# Patient Record
Sex: Female | Born: 1965 | Race: White | Hispanic: No | Marital: Single | Smoking: Current every day smoker
Health system: Southern US, Community
[De-identification: ages and names within clinical notes are randomized; demographics above are authoritative.]

## PROBLEM LIST (undated history)

## (undated) DIAGNOSIS — R51 Headache: Secondary | ICD-10-CM

## (undated) DIAGNOSIS — G8929 Other chronic pain: Secondary | ICD-10-CM

## (undated) DIAGNOSIS — E785 Hyperlipidemia, unspecified: Secondary | ICD-10-CM

## (undated) DIAGNOSIS — I10 Essential (primary) hypertension: Secondary | ICD-10-CM

## (undated) HISTORY — PX: APPENDECTOMY: SHX54

## (undated) HISTORY — DX: Hyperlipidemia, unspecified: E78.5

## (undated) HISTORY — PX: OTHER SURGICAL HISTORY: SHX169

## (undated) HISTORY — PX: BREAST SURGERY: SHX581

## (undated) HISTORY — DX: Headache: R51

## (undated) HISTORY — DX: Essential (primary) hypertension: I10

## (undated) HISTORY — DX: Other chronic pain: G89.29

## (undated) HISTORY — PX: ABDOMINAL HYSTERECTOMY: SHX81

---

## 1999-09-30 ENCOUNTER — Encounter: Payer: Self-pay | Admitting: Family Medicine

## 1999-09-30 ENCOUNTER — Ambulatory Visit (HOSPITAL_COMMUNITY): Admission: RE | Admit: 1999-09-30 | Discharge: 1999-09-30 | Payer: Self-pay | Admitting: Family Medicine

## 2006-10-17 ENCOUNTER — Ambulatory Visit: Payer: Self-pay

## 2006-10-23 ENCOUNTER — Inpatient Hospital Stay: Payer: Self-pay

## 2007-02-11 ENCOUNTER — Emergency Department: Payer: Self-pay | Admitting: Emergency Medicine

## 2008-10-04 ENCOUNTER — Emergency Department: Payer: Self-pay | Admitting: Unknown Physician Specialty

## 2009-09-09 ENCOUNTER — Emergency Department: Payer: Self-pay | Admitting: Emergency Medicine

## 2009-10-06 ENCOUNTER — Ambulatory Visit: Payer: Self-pay | Admitting: Gastroenterology

## 2010-03-19 ENCOUNTER — Emergency Department: Payer: Self-pay | Admitting: Emergency Medicine

## 2010-05-08 LAB — HM MAMMOGRAPHY

## 2011-04-10 ENCOUNTER — Ambulatory Visit: Payer: Self-pay

## 2011-08-30 ENCOUNTER — Ambulatory Visit: Payer: Self-pay | Admitting: Internal Medicine

## 2011-09-01 ENCOUNTER — Ambulatory Visit: Payer: Self-pay

## 2012-01-06 DIAGNOSIS — E0590 Thyrotoxicosis, unspecified without thyrotoxic crisis or storm: Secondary | ICD-10-CM | POA: Insufficient documentation

## 2012-03-06 ENCOUNTER — Emergency Department: Payer: Self-pay | Admitting: Internal Medicine

## 2012-04-22 ENCOUNTER — Emergency Department: Payer: Self-pay | Admitting: Emergency Medicine

## 2012-04-22 LAB — COMPREHENSIVE METABOLIC PANEL
Alkaline Phosphatase: 100 U/L (ref 50–136)
Calcium, Total: 9.4 mg/dL (ref 8.5–10.1)
Chloride: 105 mmol/L (ref 98–107)
Co2: 25 mmol/L (ref 21–32)
EGFR (African American): >60
EGFR (Non-African Amer.): >60
SGOT(AST): 29 U/L (ref 15–37)
SGPT (ALT): 38 U/L (ref 12–78)

## 2012-04-22 LAB — CK TOTAL AND CKMB (NOT AT ARMC)
CK, Total: 94 U/L (ref 21–215)
CK-MB: 0.8 ng/mL (ref 0.5–3.6)

## 2012-04-22 LAB — URINALYSIS, COMPLETE
Bilirubin,UR: NEGATIVE
Glucose,UR: NEGATIVE mg/dL (ref 0–75)
Ketone: NEGATIVE
Protein: NEGATIVE
RBC,UR: NONE SEEN /HPF (ref 0–5)
Specific Gravity: 1.01 (ref 1.003–1.030)
Squamous Epithelial: 2
WBC UR: 1 /HPF (ref 0–5)

## 2012-04-22 LAB — CBC
HCT: 43.4 % (ref 35.0–47.0)
MCH: 29.1 pg (ref 26.0–34.0)
MCV: 85 fL (ref 80–100)
Platelet: 204 10*3/uL (ref 150–440)
RDW: 14.2 % (ref 11.5–14.5)
WBC: 8.6 10*3/uL (ref 3.6–11.0)

## 2012-04-22 LAB — MAGNESIUM: Magnesium: 1.9 mg/dL

## 2012-05-08 ENCOUNTER — Encounter: Payer: Self-pay | Admitting: Internal Medicine

## 2012-05-08 ENCOUNTER — Ambulatory Visit (INDEPENDENT_AMBULATORY_CARE_PROVIDER_SITE_OTHER): Payer: BC Managed Care – PPO | Admitting: Internal Medicine

## 2012-05-08 VITALS — BP 92/68 | HR 120 | Temp 98.7°F | Resp 16 | Ht 71.0 in | Wt 297.0 lb

## 2012-05-08 DIAGNOSIS — E0590 Thyrotoxicosis, unspecified without thyrotoxic crisis or storm: Secondary | ICD-10-CM

## 2012-05-08 DIAGNOSIS — F419 Anxiety disorder, unspecified: Secondary | ICD-10-CM | POA: Insufficient documentation

## 2012-05-08 DIAGNOSIS — F411 Generalized anxiety disorder: Secondary | ICD-10-CM

## 2012-05-08 DIAGNOSIS — I10 Essential (primary) hypertension: Secondary | ICD-10-CM

## 2012-05-08 DIAGNOSIS — E785 Hyperlipidemia, unspecified: Secondary | ICD-10-CM

## 2012-05-08 NOTE — Progress Notes (Addendum)
Subjective:     Patient ID: Martha [name], female   DOB: [birth year], 46 y.o.   MRN: [id number]  HPI [name] is a pleasant 46 y/o woman, referred by her PCP, for evaluation and treatment of hyperthyroidism.  I received and reviewed records from Dr. Francis Dowse B. Moffett, including latest 2 office visit notes (from 10/24 and 11/07 of this year) and thyroid labs: - 12/25/2011: TSH 0.037 (no nl range available for lab, but mentioned abnormal), fT4 1.26; no prior thyroid ds. history. She remembers that at that time she had high fever of 103.72F, went to Urgent Care and was started on ABx; she was told her thyroid is abnormal and to have tests repeated in 2 mo. - 04/18/2012: TSH 0.052, fT4 1.27, TT3 147; had occasional palpitations/increased pulse office EKG: NSR. Of note, she is on Albuterol. - 05/02/2012: TSH 0.128, fT4 1.14; Atenolol 25 mg daily started, but she does not taking it because she feels like "everything moving in slow motion" - she took the medication in am then; endo referral placed She did not have thyroid U/S or scans performed.  She tells me that she was in the ED with palpitations, BP 194/116, P 130, but left AMA as she had to wait for 4 hours. Today BP normal, at 92/68, pulse 120 at the beginning of the visit, but 102 at the end. She had 2 teeth extracted yesterday and mentions that her sBP was 120 then.  ROS: Denies lumps in her throat or dysphagia, has palpitations ~3x a day, no CP/SOB, has hyperthermia (since her TAH+BSO 2008), has weight gain (15 lbs in 2 weeks - believes she is retaining fluid after changing her BP med), denies N/V/D/C, has tremors, no visual problems. Has HAs - takes Ibuprofen. Has anxiety.   She has no family h/o thyroid ds. or autoimmune ds. per her knowledge.   Other medical history: HTN, poorly controlled, last BP in PCP's office: 160/84, P 90 HL OSA Ventricular hypokinesis (?) Fibrocystic breast ds. DJD, lumbar GERD Obesity, BMI 41 Surgical menopause  since 2008  Past Surgical History  Procedure Date  . Breast surgery     BIOPSY  1999  . Appendectomy     1974  . Abdominal hysterectomy     2008  . Right knee     TENDONS AND LIGAMENTS    Current Outpatient Prescriptions on File Prior to Visit  Medication Sig Dispense Refill  . atenolol (TENORMIN) 25 MG tablet Take 25 mg by mouth daily.      . clonazePAM (KLONOPIN) 1 MG tablet Take 1 mg by mouth daily.      Marland Kitchen olmesartan-hydrochlorothiazide (BENICAR HCT) 40-25 MG per tablet Take 1 tablet by mouth daily.      Marland Kitchen omeprazole (PRILOSEC) 40 MG capsule Take 40 mg by mouth daily.      . pravastatin (PRAVACHOL) 40 MG tablet Take 40 mg by mouth daily.        Allergies  Allergen Reactions  . Demerol (Meperidine)     Family History  Problem Relation Age of Onset  . Heart disease Mother   . Hypertension Mother   . Diabetes Mother   . Heart disease Father   . Hypertension Father   . Diabetes Father   . Cancer Maternal Grandmother     CANCER  . Heart disease Maternal Grandmother   . Hypertension Maternal Grandmother   . Diabetes Maternal Grandmother    Social history: Location manager, single, smokes 1PPD, occasional alcohol, no drugs,  exercise: starting with a personal trainer  Review of Systems See above    Objective:   Physical Exam BP 92/68  Pulse 120  Temp 98.7 F (37.1 C) (Oral)  Resp 16  Ht 5\' 11"  (1.803 m)  Wt 297 lb (134.718 kg)  BMI 41.42 kg/m2  SpO2 98% Repeat pulse 102 bpm at end of encounter.  Constitutional: overweight, in NAD Eyes: PERRLA, EOMI, no exophthalmos ENT: moist mucous membranes, no thyromegaly, no thyroid nodules palpated, no cervical lymphadenopathy Cardiovascular: tachycardia, No MRG, bilat. LE edema pitting +1 Respiratory: CTA B Gastrointestinal: abdomen soft, NT, ND, BS+ Musculoskeletal: no deformities, strength intact in all 4 Skin: moist, warm, no rashes Neurological: slight tremor with outstretched hands, DTR normal in all 4       Assessment:     1. Subclinical hyperthyroidism - likely 2/2 thyroiditis episode 4 months ago, slowly improving 2. HTN     Plan:     1. Subclinical hyperthyroidism - I explained her diagnosis to her and the fact that her actual thyroid hormone, fT4 is normal and her TSH is improving - I recommended her to restart Atenolol at 25 mg, but taken at night, rather than in am. If she can tolerate this dose, and her pulse is still >100 bpm, she can increase the dose to 50 mg nightly. Her BP was normal today, but this is low for her, however, Atenolol has relative beta 1 receptor selectivity, so it should affect her pulse more than her BP. - we will need to repeat her thyroid tests in 6-8 weeks and she would like to have these done in her PCPs office - given printout orders for TSH, fT4 and fT3 - If these labs are still abnormal and worsening, I would be tempted to next check a thyroid uptake and scan - I explained the natural course of thyroiditis, with initial hyperthyroidism, usually followed by normal thyroid status, but also possibly by hypothyroidism  2. HTN - It is not likely that the mildly low TSH is the dominant cause for her HTN episodes - pt has weight gain and fluid retention (which is chronic, but exacerbated lately), has recently switched her BP medicine to Benicar-HCT  40-25, she mentions she was on another combination pill before  - BP normal today (but low for her) - will inform PCP know  RTC in 6 months.   06/05/2012: received faxed labs performed in PCP's office (Dr. Wonda Cheng) on 05/30/2012: - TSH 0.042 -  fT4 1.58 - TT3 132  No normal ranges given for the above labs, but TSH only is highlighted as abnormal. Called pt and decided to check a Thyroid Uptake and Scan. Will schedule for her.

## 2012-05-08 NOTE — Patient Instructions (Addendum)
You have a condition named: Subclinical hyperthyroidism, which I believe was due to an episode of thyroiditis that you had this summer. Please repeat thyroid labs in Dr. Rhunette Croft office in 2 months. Please restart taking Atenolol 25 mg at night, before going to bed. If you tolerate this OK, increase to 50 mg nightly, until your pulse is <100 consistently.

## 2012-05-28 ENCOUNTER — Emergency Department: Payer: Self-pay | Admitting: Emergency Medicine

## 2012-05-28 LAB — CBC WITH DIFFERENTIAL/PLATELET
Basophil #: 0 10*3/uL (ref 0.0–0.1)
Eosinophil %: 2.1 %
HCT: 39.8 % (ref 35.0–47.0)
HGB: 13.9 g/dL (ref 12.0–16.0)
Lymphocyte #: 0.6 10*3/uL — ABNORMAL LOW (ref 1.0–3.6)
Lymphocyte %: 9.2 %
MCHC: 34.8 g/dL (ref 32.0–36.0)
MCV: 86 fL (ref 80–100)
Monocyte %: 4.8 %
Neutrophil #: 5.4 10*3/uL (ref 1.4–6.5)
RBC: 4.65 10*6/uL (ref 3.80–5.20)
WBC: 6.4 10*3/uL (ref 3.6–11.0)

## 2012-05-28 LAB — COMPREHENSIVE METABOLIC PANEL
Alkaline Phosphatase: 120 U/L (ref 50–136)
Anion Gap: 7 (ref 7–16)
BUN: 16 mg/dL (ref 7–18)
Calcium, Total: 9.2 mg/dL (ref 8.5–10.1)
Co2: 24 mmol/L (ref 21–32)
SGPT (ALT): 29 U/L (ref 12–78)
Sodium: 140 mmol/L (ref 136–145)

## 2012-05-28 LAB — URINALYSIS, COMPLETE
Glucose,UR: NEGATIVE mg/dL (ref 0–75)
Ketone: NEGATIVE
Leukocyte Esterase: NEGATIVE
Nitrite: NEGATIVE
RBC,UR: 1 /HPF (ref 0–5)
Specific Gravity: 1.029 (ref 1.003–1.030)
Squamous Epithelial: 9

## 2012-05-30 ENCOUNTER — Ambulatory Visit: Payer: BC Managed Care – PPO | Admitting: Family Medicine

## 2012-06-03 LAB — CULTURE, BLOOD (SINGLE)

## 2012-06-04 ENCOUNTER — Telehealth: Payer: Self-pay

## 2012-06-04 NOTE — Telephone Encounter (Signed)
Phone call from pt's pcp office, (not Dr. Dayton Martes) they did lab work on pt and her thyroid is more depressed, they are going to fax over results for Dr. Elvera Lennox to review.

## 2012-06-05 NOTE — Addendum Note (Signed)
Addended by: Carlus Pavlov on: 06/05/2012 08:19 AM   Modules accepted: Orders

## 2012-06-05 NOTE — Telephone Encounter (Signed)
Called pt and let her know the thyroid test results. Will schedule a Thyroid Uptake and Scan for her. She agrees to plan.

## 2012-06-10 ENCOUNTER — Telehealth: Payer: Self-pay | Admitting: Internal Medicine

## 2012-06-10 NOTE — Telephone Encounter (Signed)
LEFT MESSAGE ON PATIENT WORK # @ EXT. 269 THAT PATIENT CARE COORDINATORS ARE WORKING ON THIS ORDER FOR HER ULTRASOUND OF THE THYROID AND WILL CONTACT HER WHEN SCHEDULED. INFORMED OF THIS BY [facility] DAWKINS, P[facility]/

## 2012-06-13 ENCOUNTER — Telehealth: Payer: Self-pay | Admitting: *Deleted

## 2012-06-13 NOTE — Telephone Encounter (Signed)
Lab results faxed to [facility] at [facility].

## 2012-06-17 ENCOUNTER — Telehealth: Payer: Self-pay | Admitting: *Deleted

## 2012-06-17 NOTE — Telephone Encounter (Signed)
Thank you, Sue.

## 2012-06-17 NOTE — Telephone Encounter (Signed)
Nurse Huntley Dec at [facility] in [place] called to let you be aware that the test on patient could not be done Thyroid Uptake and scan this morning due to patient having eatten breakfast this morning. Test would not have been accurate. patinet stated no one told her she needed to be NPO for this test. Patient given scheduling # of their scheduling dept. To call and reschedule test to be done.

## 2012-07-01 ENCOUNTER — Ambulatory Visit: Payer: Self-pay

## 2012-07-02 ENCOUNTER — Ambulatory Visit (HOSPITAL_COMMUNITY): Payer: BC Managed Care – PPO

## 2012-07-03 ENCOUNTER — Other Ambulatory Visit (HOSPITAL_COMMUNITY): Payer: BC Managed Care – PPO

## 2012-07-04 ENCOUNTER — Telehealth: Payer: Self-pay | Admitting: Internal Medicine

## 2012-07-04 DIAGNOSIS — E0590 Thyrotoxicosis, unspecified without thyrotoxic crisis or storm: Secondary | ICD-10-CM

## 2012-07-04 NOTE — Telephone Encounter (Signed)
The patient called regarding lab results.  Please call patient at [phone].

## 2012-07-04 NOTE — Telephone Encounter (Signed)
CALLED [facility] IN [place] . NURSE TO FAX REPORT TO ME , TO GIVE TO DR. GHERGHE TO REVIEW.

## 2012-07-04 NOTE — Telephone Encounter (Signed)
LEFT MESSAGE ON VM OF CB# OF NEED TO KNOW WHERE DID SHE GO TO HAVE LAB TEST DONE THAT WERE ORDERED IN 04/2012 AND IF SHE HAD HER THYROID UPTAKE AND SCAN DONE IN [place] AND GET THOSE REPORTS ALSO FOR DR. GHERGHE TO REVIEW. PLEASE ADVISE. SUE

## 2012-07-05 ENCOUNTER — Telehealth: Payer: Self-pay | Admitting: *Deleted

## 2012-07-05 NOTE — Telephone Encounter (Signed)
LAB ORDERS PER DR. GHERGHE FAXED TO [facility] TO BE DRAWN THERE PER PATIENT REQUEST. THEN WILL HAVE OFFICE VISIT WITH DR. Marguerite Olea  ON Monday AT 4PM. ORDERS FAXED TO [phone].

## 2012-07-05 NOTE — Addendum Note (Signed)
Addended by: Carlus Pavlov on: 07/05/2012 09:25 AM   Modules accepted: Orders

## 2012-07-05 NOTE — Telephone Encounter (Addendum)
Received thyroid uptake and scan result from [facility] (07/02/2012): - 152 mCi I123 - uptake normal at 6 h and 24 h (14.4% and 28.5%, respectively). Scan shows homogeneous uptake.  Called pt and explained result. It is surprising that her U&S is normal, in the context of the low TSH and borderline high fT4, however, there is a time-delay of approx 1 mo between the tests and the U&S. Will repeat TFTs (TSH, freeT3 and freeT4) and add TSI's - she prefers to have them done in Dr. Gaylene Brooks office - she has an appt with him in 3 weeks. Will fax the orders to PCP's office.

## 2012-07-16 ENCOUNTER — Encounter: Payer: Self-pay | Admitting: Internal Medicine

## 2012-07-16 NOTE — Progress Notes (Signed)
Received labs from PCP, Dr. Wonda Cheng - [facility] (07/11/2012): - TSH 0.312 (increased from 0.042 on 05/30/2012) (0.450-4.50) - free T4 1.48 (decreased from 1.58) (0.82-1.77) - free T3 3.7 (2.0-4.4) - Thyrotropin R antibody (TSI) slightly high at 1.88 (<1.75) - Ca2+ a little high at 10.3, but pt on HCTZ, previous values have been normal I tried to get in touch with pt yesterday and today on both available tel. nrs and left VM for her to call me.  Plan:  - TSH improving, so it is possible that this was a case of thyroiditis - anti-thyroid antibodies can be mildly elevated in this - I plan to see her back in 2 months and check labs here - discussed with her and she agrees - Will let Dr. Marguerite Olea know about plan - appreciate his help with following her up with repeated TFTs

## 2012-08-12 ENCOUNTER — Encounter: Payer: Self-pay | Admitting: Internal Medicine

## 2013-03-24 IMAGING — CR DG LUMBAR SPINE 2-3V
1 series · 4 of 4 positions shown · non-contrast
Comparison: none

REASON FOR EXAM: low back pain/injury
COMMENTS:

[Series 1: t lumbar spine ap · 0.14mm/px · 4 of 4 slices shown]
[im 1/4]
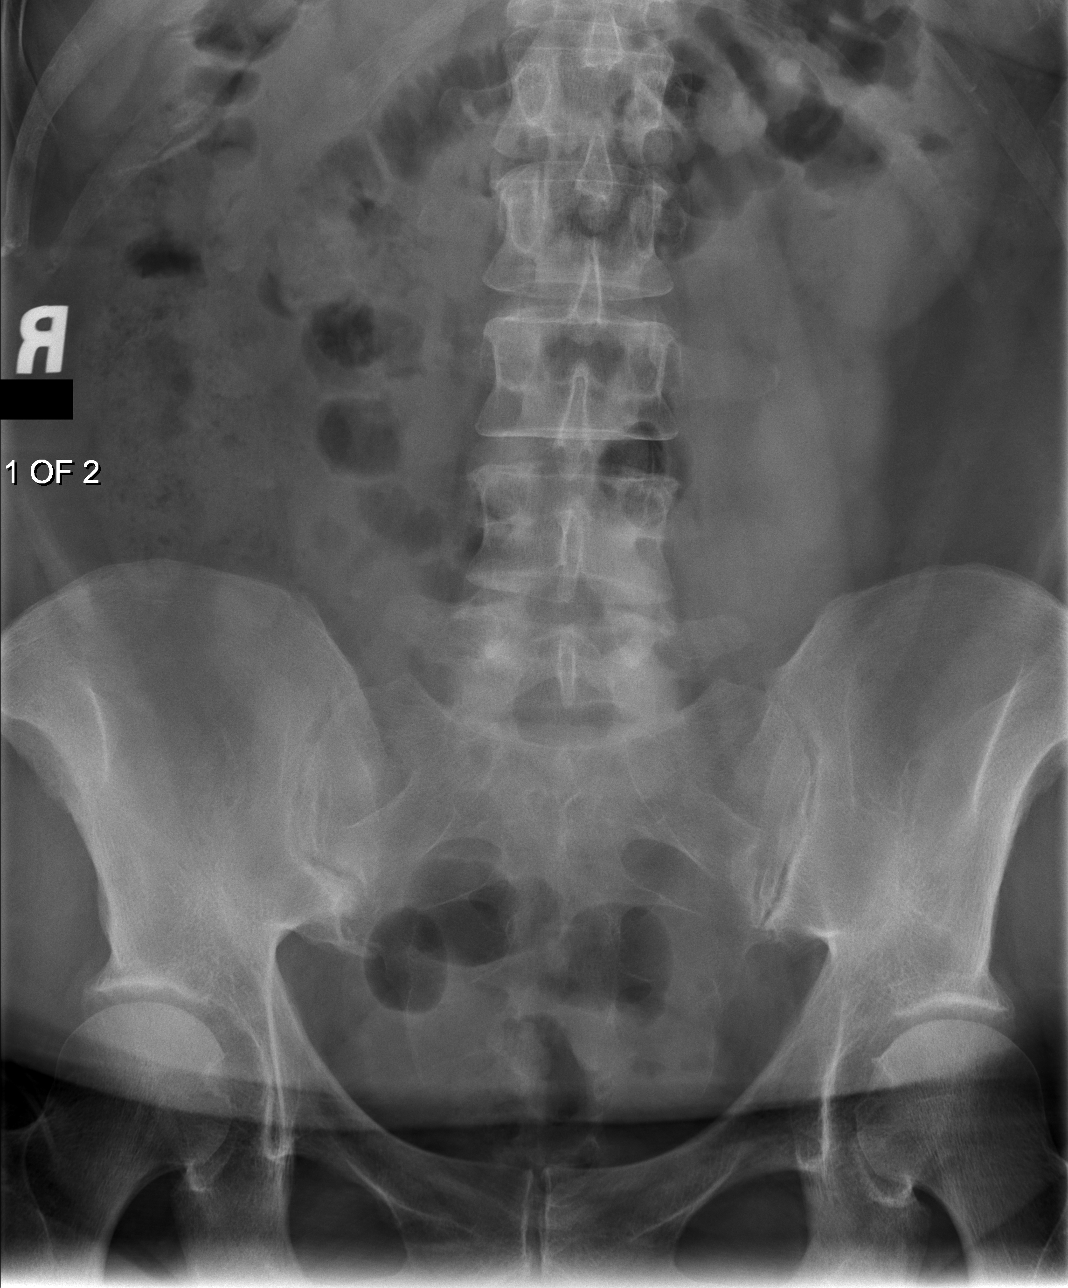
[im 2/4]
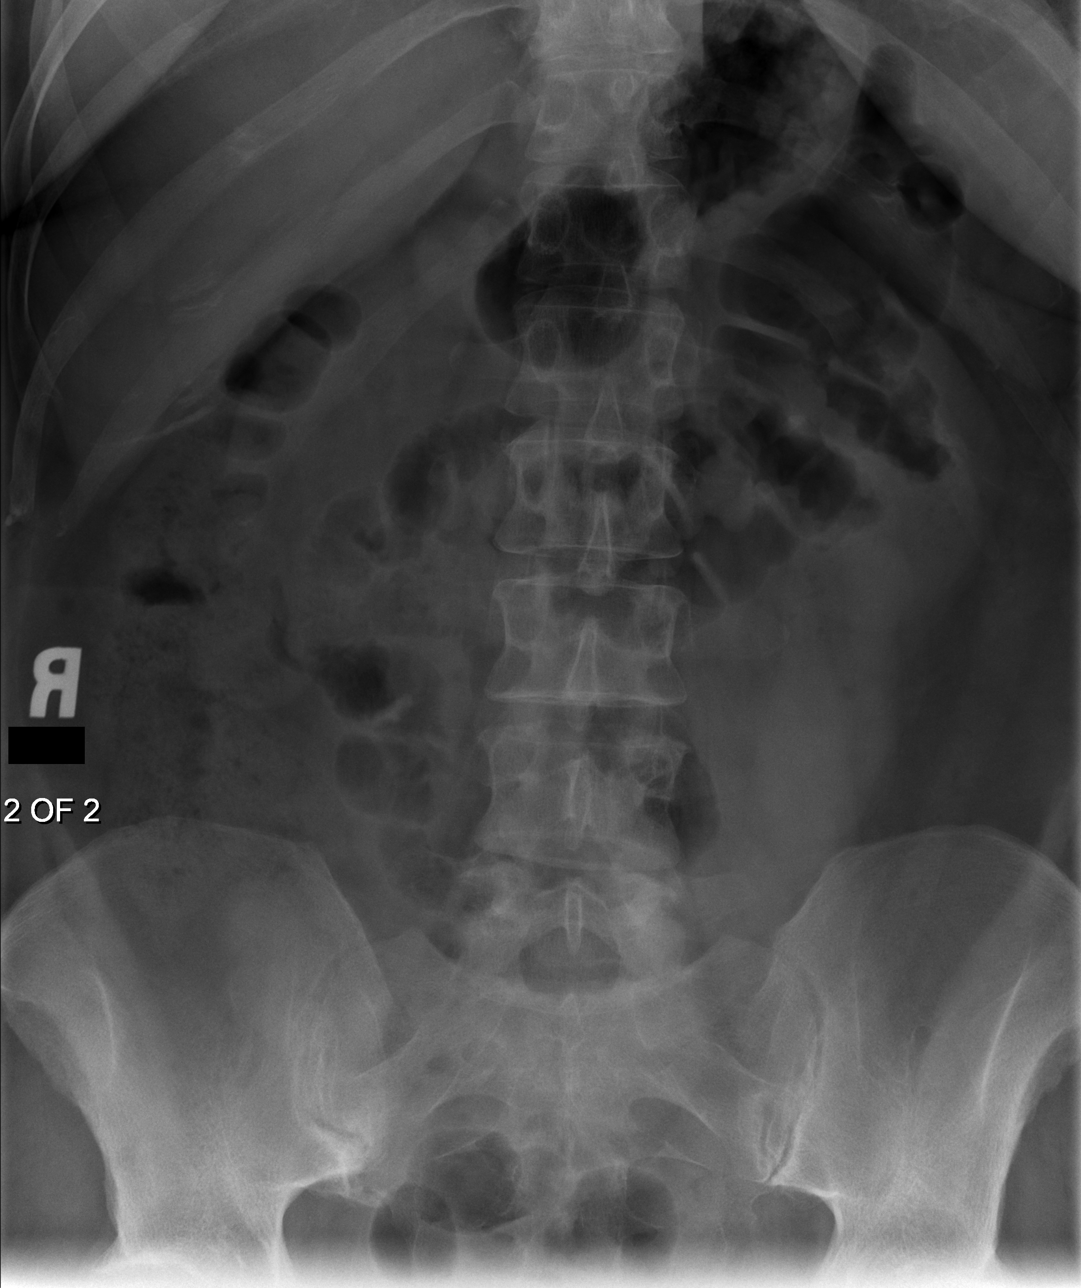
[im 3/4]
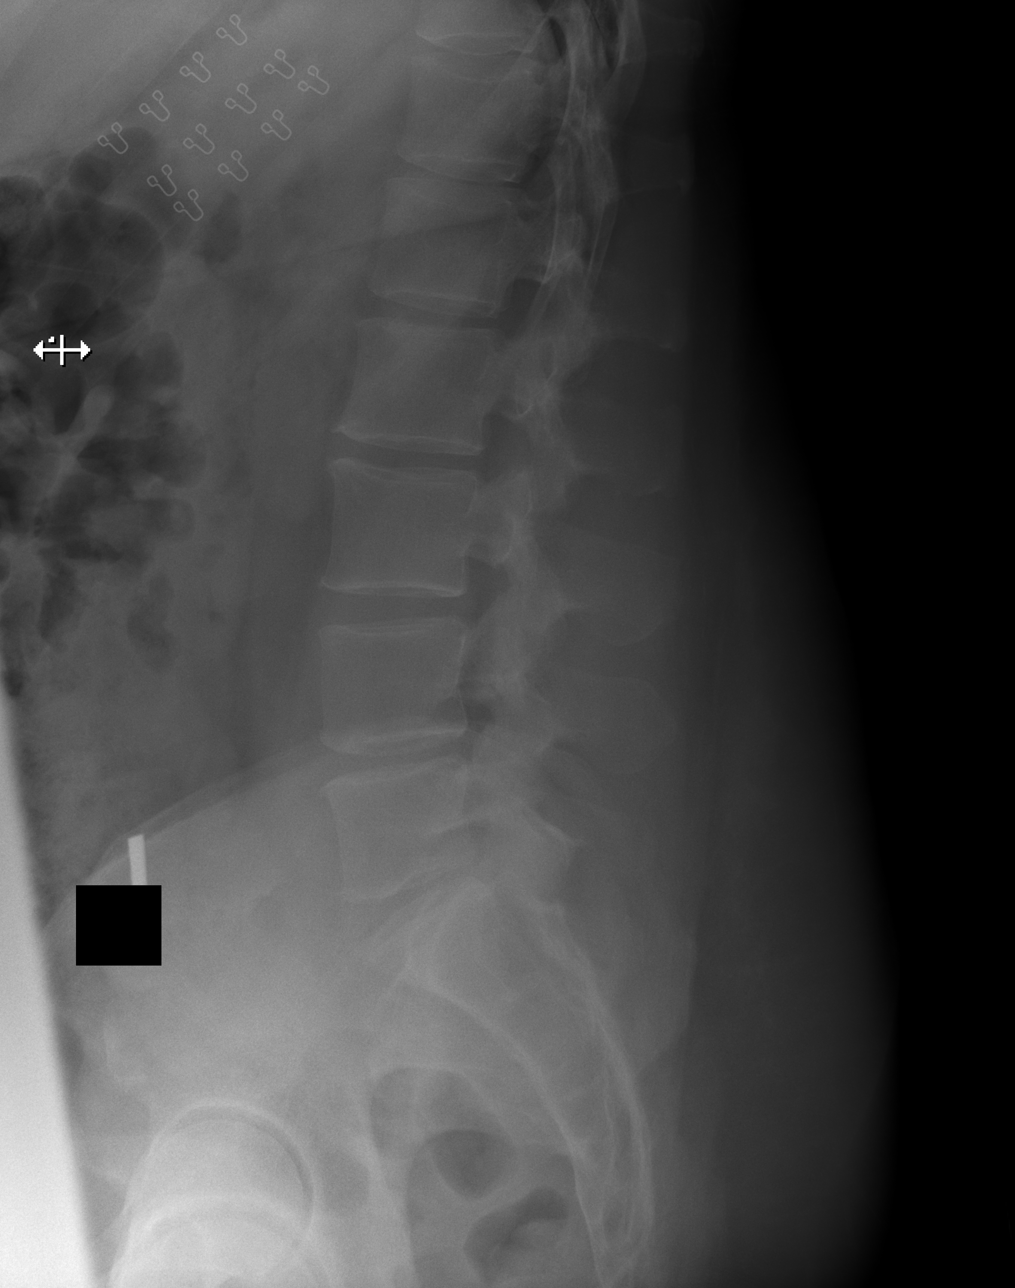
[im 4/4]
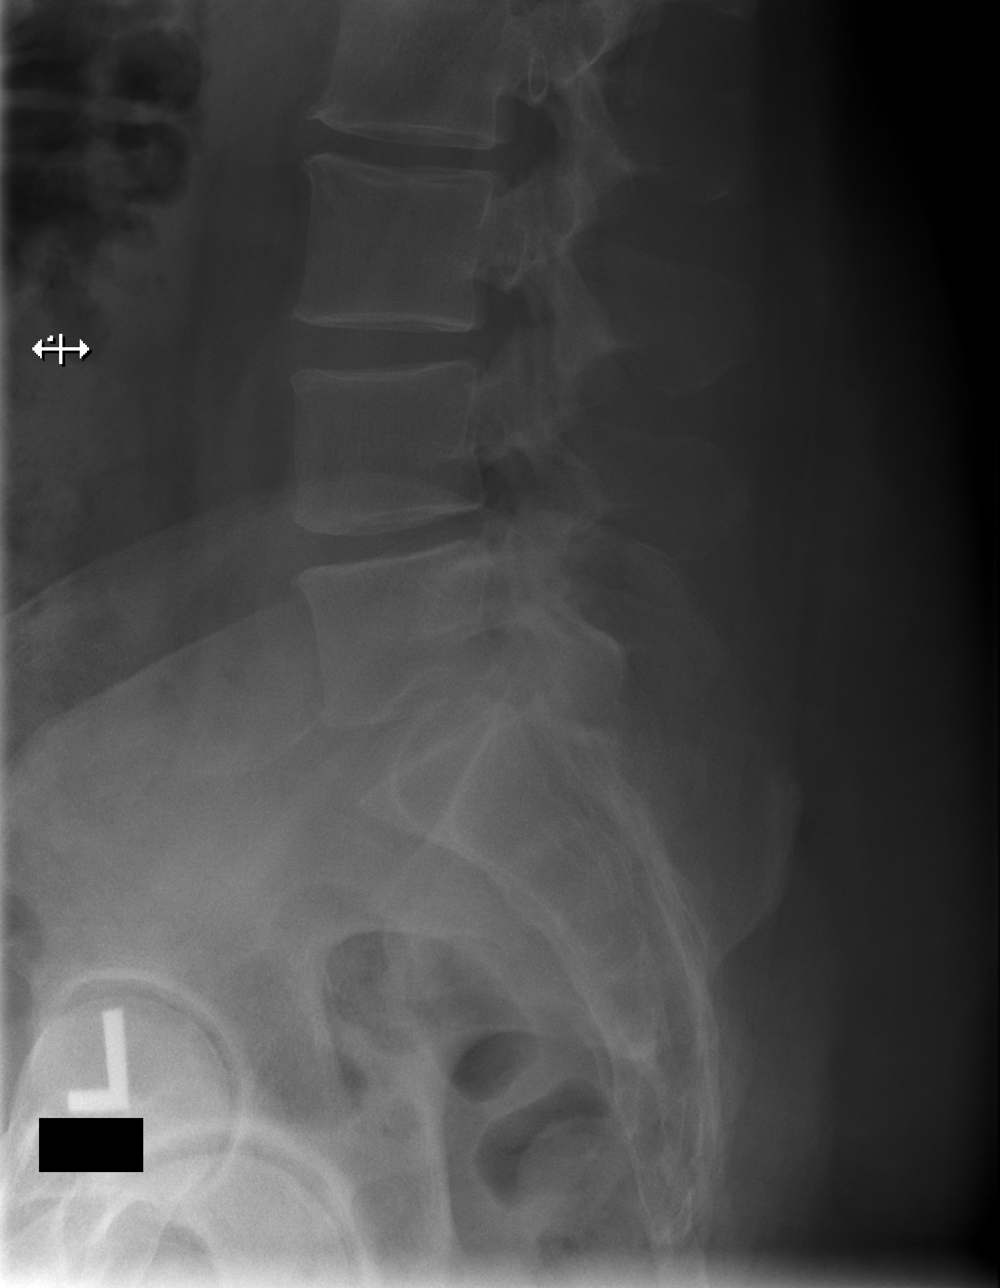

[4 of 4 positions shown; findings below may reference images not displayed]

PROCEDURE:     DXR - DXR LUMBAR SPINE AP AND LATERAL  - March 06, 2012 [DATE]

RESULT:     The lumbar vertebral bodies are preserved in height. There is
disc space narrowing at L4-L5. No pars defect or spondylolisthesis is
demonstrated. The spinous processes appear intact as do the pedicles and
transverse processes. The observed portions of the sacrum appear normal.
IMPRESSION: There is very mild narrowing of the disc at L4-L5. There is
no evidence of acute compression fracture nor other acute abnormality.

[REDACTED]

## 2013-06-15 IMAGING — CR DG CHEST 2V
1 series · 2 of 2 positions shown · non-contrast
Comparison: none

REASON FOR EXAM: sob
COMMENTS:

[Series 1: w chest pa · 0.14mm/px · 2 of 2 slices shown]
[im 1/2]
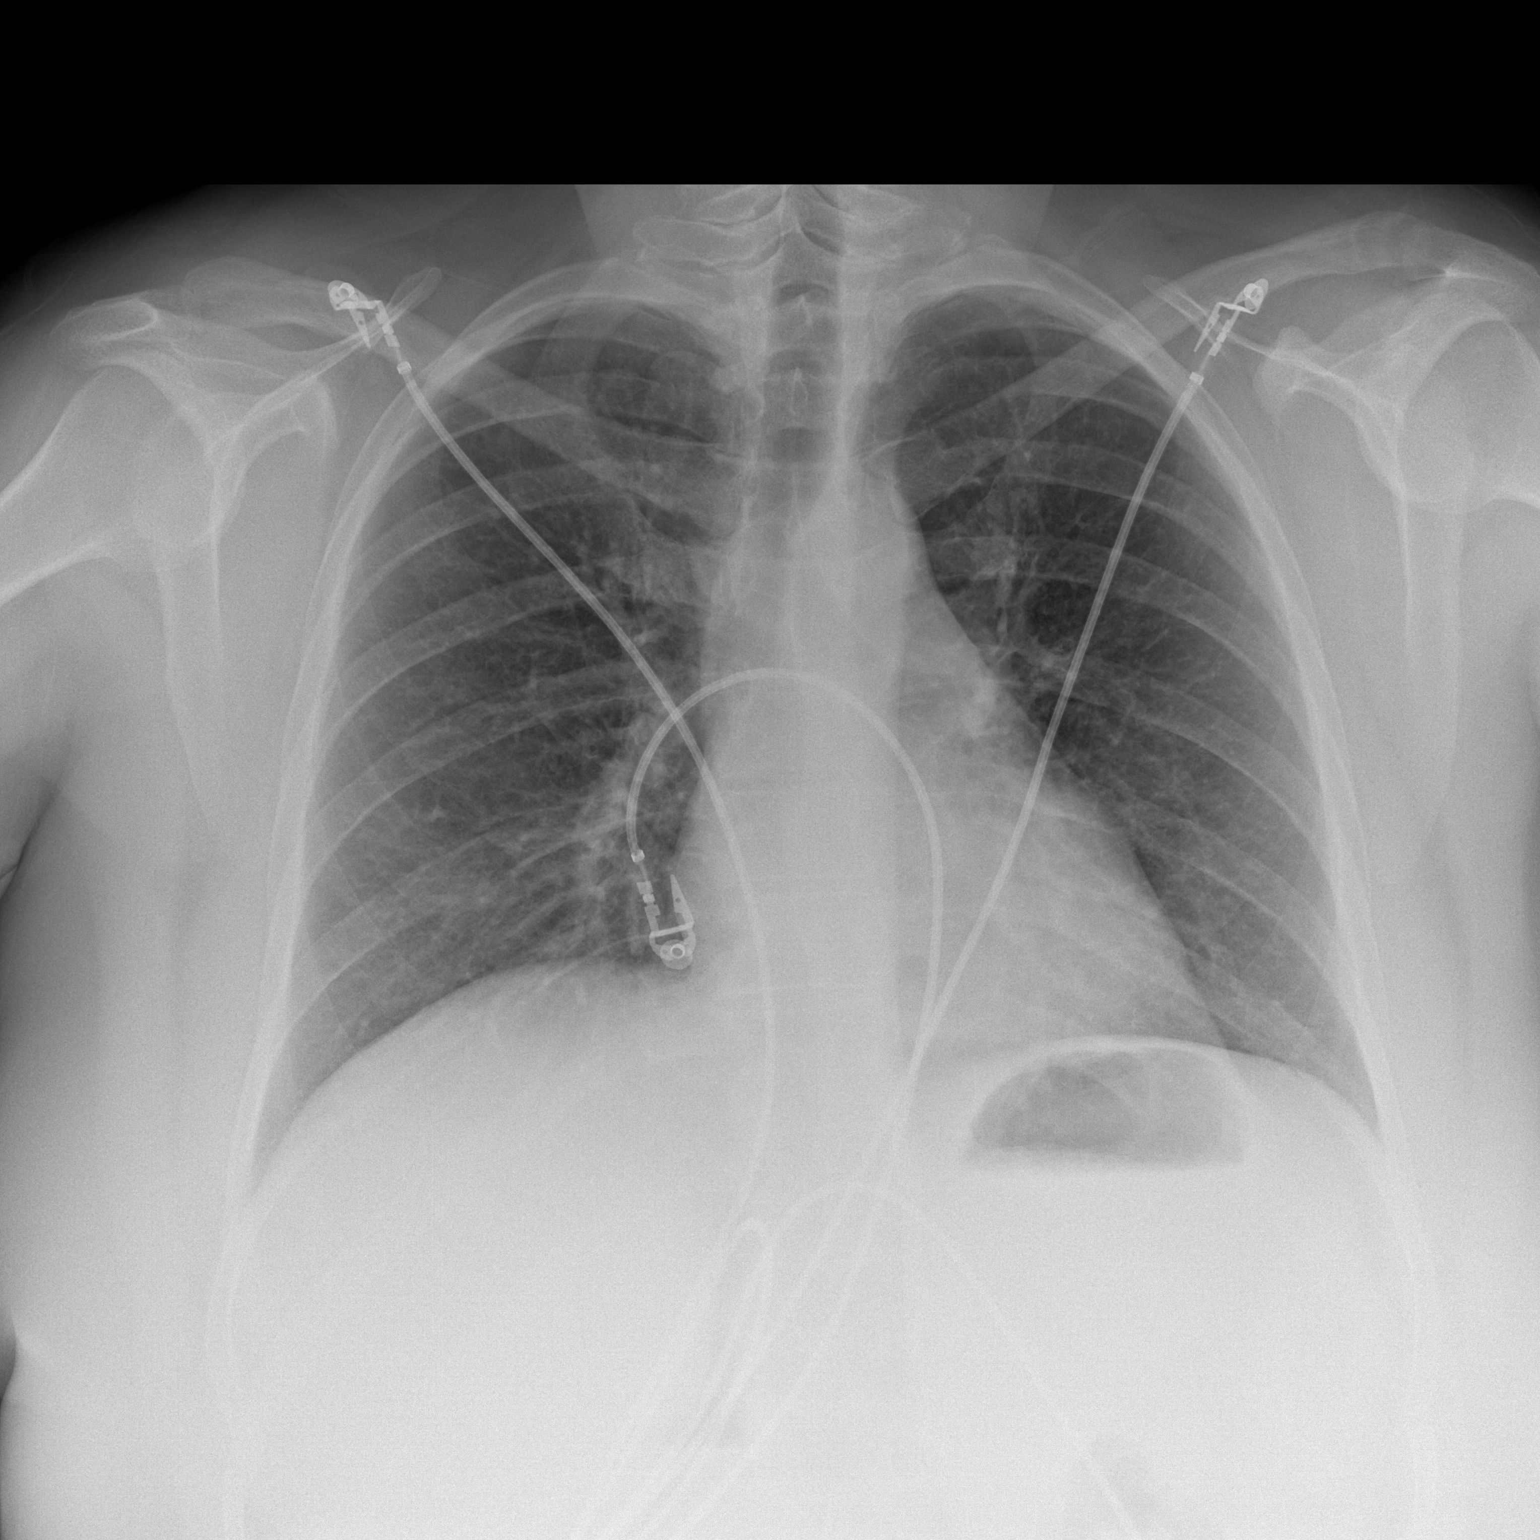
[im 2/2]
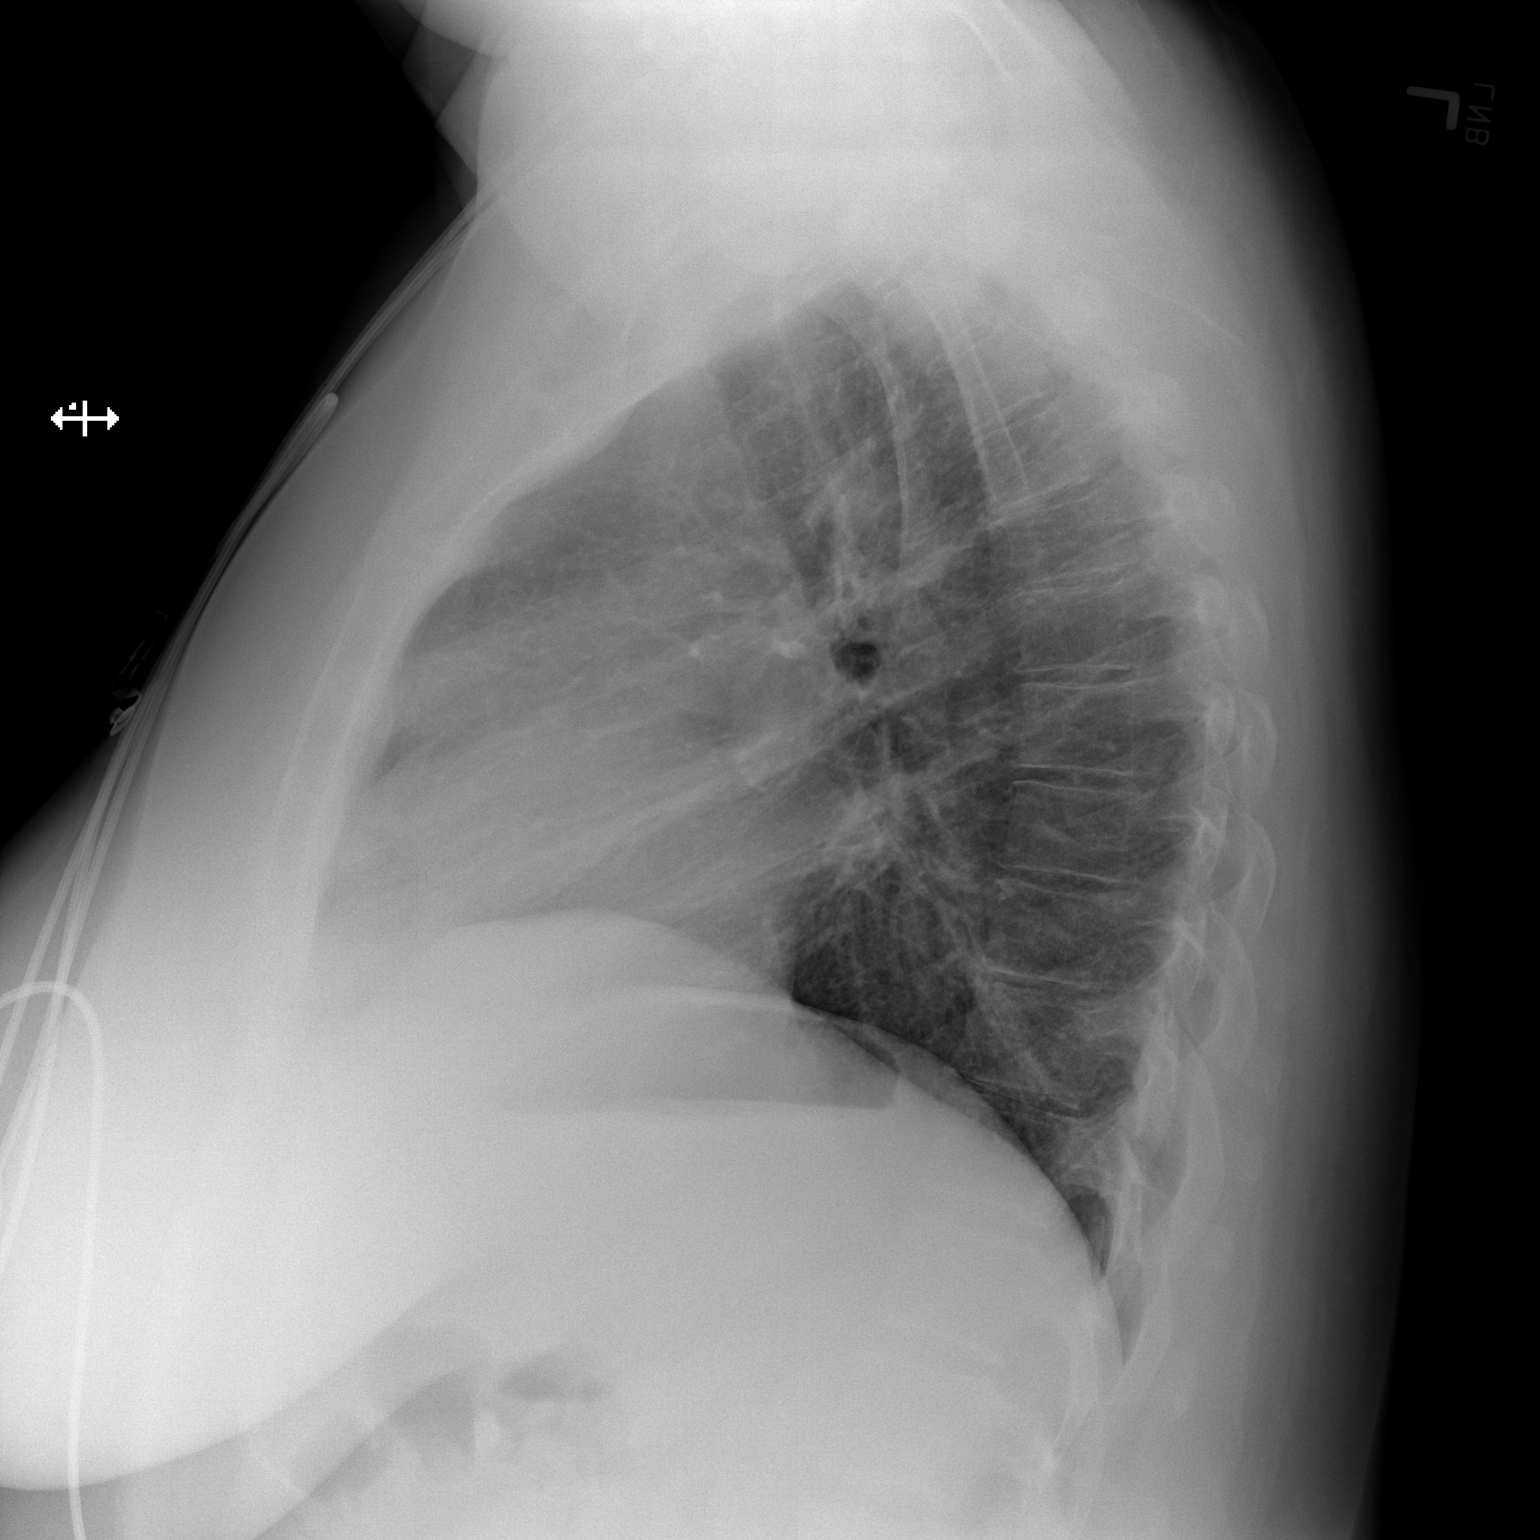

[2 of 2 positions shown; findings below may reference images not displayed]

PROCEDURE:     DXR - DXR CHEST PA (OR AP) AND LATERAL  - May 28, 2012  [DATE]

RESULT:     The are adequately inflated. There is no focal infiltrate. The
cardiac silhouette is normal in size. The pulmonary vascularity is not
engorged. There is no pleural effusion. The mediastinum is normal in width.
There is no evidence of a pneumothorax.
IMPRESSION: There is no evidence of acute cardiopulmonary abnormality.

[REDACTED]

## 2013-06-23 ENCOUNTER — Ambulatory Visit: Payer: Self-pay | Admitting: Anesthesiology

## 2013-06-25 ENCOUNTER — Ambulatory Visit: Payer: Self-pay | Admitting: Otolaryngology

## 2013-06-27 LAB — PATHOLOGY REPORT

## 2013-07-19 IMAGING — NM NM THYROID IMAGING W/ UPTAKE SINGLE (24 HR)
1 series · 3 of 3 positions shown · non-contrast
Comparison: none

[phone] Fax [phone] ...
COMMENTS:

PROCEDURE:     KNM - KNM THYROID E-MZF 24HR [DATE]  [DATE]
RESULT:     The patient was given a dose of 152.4 uCi of Godine-WI8. Uptake
is obtained at 6 hours and 24 hours and measures 14.4% at 6 hours and 28.5%
at 24 hours which are both within the normal range. The images demonstrate
grossly homogeneous uptake without a photopenic or focal areas increased
localization.

[Series 1000: (id) thyroid scan · 2.40mm/px · 3 of 3 slices shown]
[im 1/3  full-range]
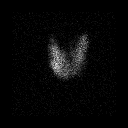
[im 2/3  full-range]
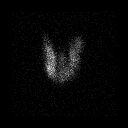
[im 3/3  full-range]
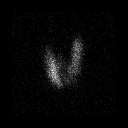

[3 of 3 positions shown; findings below may reference images not displayed]

IMPRESSION: Normal-appearing thyroid scan. Normal thyroid uptake.

[REDACTED]

## 2014-10-17 NOTE — Op Note (Signed)
PATIENT NAME:  Martha [name], Martha [name] MR#:  [id number] DATE OF BIRTH:  December 14, 1965  DATE OF PROCEDURE:  06/25/2013  PREOPERATIVE DIAGNOSIS: Chronic tonsillitis with tonsillar hyperplasia.   POSTOPERATIVE DIAGNOSIS: Chronic tonsillitis with tonsillar hyperplasia.   PROCEDURE PERFORMED: Tonsillectomy.   SURGEON: Marion DownerScott Kaybree Williams, MD.   ANESTHESIA: General endotracheal.   INDICATIONS: This is [name] 49 year old female with persistent tonsillar pain unresponsive to medical management despite previous broad-spectrum antibiotics.   FINDINGS: Tonsils were 3+ in size and cryptic and scarred into the tonsillar bed.   COMPLICATIONS: None.   DESCRIPTION OF PROCEDURE: After obtaining informed consent, the patient was taken to the operating room and placed in the supine position. After induction of general endotracheal anesthesia, the patient was turned slightly and the head draped with the eyes protected. [name] McIvor retractor was used to open the mouth. The palate was palpated and there was no evidence of submucous cleft. The nasopharynx was inspected. There was no significant adenoid tissue. The right tonsil was grasped with an Allis and resected from the tonsillar fossa with the Bovie. Some bleeding was encountered near the inferior pole of the tonsil, which did not respond to cautery. [name] 4-0 Vicryl suture was passed just below the surface around the blood vessel used to tie it. The left tonsil was then resected in [name] similar fashion. Once again, there was some bleeding encountered from [name] blood vessel within the tonsillar fossa that was just under the surface. Again, [name] 4-0 Vicryl was passed just under the surface around the blood vessel and used to tie off the blood vessels and control bleeding. Minor residual bleeding was controlled with the suction Bovie. The throat was irrigated and suctioned to remove any blood clot and inspected for bleeding. Marcaine 0.25% with epinephrine 1:200,000 was injected into each tonsillar  fossa. The patient was then returned to the anesthesiologist for awakening. She was awakened and taken to the recovery room in good condition postoperatively. Blood loss was 75 mL.  ____________________________ Ollen GrossPaul S. Willeen CassBennett, MD psb:aw D: 06/25/2013 08:52:01 ET T: 06/25/2013 09:15:17 ET JOB#: [id number]  cc: Ollen GrossPaul S. Willeen CassBennett, MD, <Dictator> Sandi MealyPAUL S Lasaro Primm MD ELECTRONICALLY SIGNED 07/01/2013 8:43
